# Patient Record
Sex: Male | Born: 1960 | Race: White | Hispanic: No | Marital: Married
Health system: Midwestern US, Community
[De-identification: ages and names within clinical notes are randomized; demographics above are authoritative.]

## PROBLEM LIST (undated history)

## (undated) DIAGNOSIS — I10 Essential (primary) hypertension: Secondary | ICD-10-CM

## (undated) DIAGNOSIS — K2100 Gastro-esophageal reflux disease with esophagitis, without bleeding: Secondary | ICD-10-CM

## (undated) DIAGNOSIS — E782 Mixed hyperlipidemia: Secondary | ICD-10-CM

## (undated) DIAGNOSIS — Z0000 Encounter for general adult medical examination without abnormal findings: Secondary | ICD-10-CM

---

## 2012-07-10 MED FILL — SODIUM CHLORIDE 0.45 % IV SOLN: 0.45 % | INTRAVENOUS | Qty: 1000

## 2012-07-14 MED FILL — MIDAZOLAM HCL 5 MG/ML IJ SOLN: 5 MG/ML | INTRAMUSCULAR | Qty: 1

## 2012-07-14 MED FILL — FENTANYL CITRATE 0.05 MG/ML IJ SOLN: 0.05 MG/ML | INTRAMUSCULAR | Qty: 5

## 2016-04-12 NOTE — Patient Instructions (Signed)
Name_______________________________________Printed:____________________  Date and time of surgery__10-23-17______________________Arrival Time:__0530 [facility]______________   1. Do not eat or drink anything after 12 midnight (or____hours) prior to surgery. This includes no water, chewing gum or mints.   2. Take the following pills with a small sip of water on the morning of surgery_____________________________________________________   3. Aspirin, Ibuprofen, Advil, Naproxen, Vitamin E and other Anti-inflammatory products should be stopped for 5 days before surgery or as directed by your physician.   4. Check with your Doctor regarding stopping Plavix, Coumadin,Eliquis, Lovenox,Effient,Pradaxa,Xarelto, Fragmin or other blood thinners and follow their instructions.   5. Do not smoke, and do not drink any alcoholic beverages 24 hours prior to surgery.  This includes NA Beer.   6. You may brush your teeth and gargle the morning of surgery.  DO NOT SWALLOW WATER   7. You MUST make arrangements for a responsible adult to stay on site while you are here and take you home after your surgery. You will not be allowed to leave alone or drive yourself home.  It is strongly suggested someone stay with you the first 24 hrs. Your surgery will be cancelled if you do not have a ride home.   8. A parent/legal guardian must accompany a child scheduled for surgery and plan to stay at the hospital until the child is discharged.  Please do not bring other children with you.   9. Please wear simple, loose fitting clothing to the hospital.  Do not bring valuables (money, credit cards, checkbooks, etc.) Do not wear any makeup (including no eye makeup) or nail polish on your fingers or toes.             10. DO NOT wear any jewelry or piercings on day of surgery.  All body piercing jewelry must be removed.             11. If you have ___dentures, they will be removed before going to the OR; we will provide you a container.  If you wear ___contact  lenses or ___glasses, they will be removed; please bring a case for them.             12. Please see your family doctor/pediatrician for a history & physical and/or concerning medications.  Bring any test results/reports from your physician's office.   PCP__________________Phone___________H&P Appt. Date________             13 If you  have a Living Will and Durable Power of Attorney for Healthcare, please bring in a copy.             14. Notify your Surgeon if you develop any illness between now and surgery  time, cough, cold, fever, sore throat, nausea, vomiting, etc.  Please notify your surgeon if you experience dizziness, shortness of breath or blurred vision between now & the time of your surgery             15. DO NOT shave your operative site 96 hours prior to surgery. For face & neck surgery, men may use an electric razor 48 hours prior to surgery.             16. Shower the night before surgery with ___Antibacterial soap ___Hibiclens             17. To provide excellent care visitors will be limited to one in the room at any given time.             18.  Please bring picture  ID and insurance card.             19.  Visit our web site for additional information:  [email or web address]              20.During flu season no children under the age of 26 are permitted in the hospital for the safety of all patients.                              21. If you take a long acting insulin in the evening only  take half of your usual  dose the night  before your procedure              22. If you use a c-pap please bring DOS if staying overnight,             23.For your convenience [facility] has a pharmacy on site to fill your prescriptions.             24. If you use oxygen and have a portable tank please bring it  with you the DOS             25. Bring a complete list of all your medications with name and dose include any supplements.             26. Other__________________________________________   *Please call pre  admission testing if you any further questions   Ouida Sills         [facility]   [place]    [place]  [phone]   [place]       All above information reviewed with patient in person or by phone.Patient verbalizes understanding.All questions and concerns addressed.                                                                                                 Patient/Rep____________________                                                                                                                                    PRE OP INSTRUCTIONS

## 2016-04-16 ENCOUNTER — Inpatient Hospital Stay: Attending: Gastroenterology | Primary: Family Medicine

## 2016-04-16 MED ORDER — NORMAL SALINE FLUSH 0.9 % IV SOLN
0.9 % | INTRAVENOUS | Status: DC | PRN
Start: 2016-04-16 — End: 2016-04-16

## 2016-04-16 MED ORDER — SODIUM CHLORIDE 0.9 % IV SOLN
0.9 % | INTRAVENOUS | Status: AC
Start: 2016-04-16 — End: ?

## 2016-04-16 MED ORDER — SODIUM CHLORIDE 0.9 % IV SOLN
0.9 % | INTRAVENOUS | Status: DC
Start: 2016-04-16 — End: 2016-04-16

## 2016-04-16 MED ORDER — NORMAL SALINE FLUSH 0.9 % IV SOLN
0.9 % | Freq: Two times a day (BID) | INTRAVENOUS | Status: DC
Start: 2016-04-16 — End: 2016-04-16

## 2016-04-16 MED ORDER — ONDANSETRON HCL 4 MG/2ML IJ SOLN
4 MG/2ML | INTRAMUSCULAR | Status: DC | PRN
Start: 2016-04-16 — End: 2016-04-17

## 2016-04-16 MED ORDER — LIDOCAINE HCL (PF) 1 % IJ SOLN
1 % | Freq: Once | INTRAMUSCULAR | Status: AC | PRN
Start: 2016-04-16 — End: 2016-04-16

## 2016-04-16 MED FILL — SODIUM CHLORIDE 0.9 % IV SOLN: 0.9 % | INTRAVENOUS | Qty: 1000

## 2016-04-16 MED FILL — LIDOCAINE HCL (CARDIAC) 20 MG/ML IV SOLN: 20 MG/ML | INTRAVENOUS | Qty: 5

## 2016-04-16 MED FILL — PROPOFOL 200 MG/20ML IV EMUL: 200 MG/20ML | INTRAVENOUS | Qty: 20

## 2016-04-16 NOTE — Anesthesia Pre-Procedure Evaluation (Signed)
[facility] Department of Anesthesiology  Pre-Anesthesia Evaluation/Consultation       Name:  Ronnie King                                         Age:  55 y.o.  MRN:    [id number]           Procedure (Scheduled): EGD ESOPHAGOGASTRODUODENOSCOPY   Surgeon:     Dr. Delmar Landau, MD     Allergies   Allergen Reactions   ??? Milk-Related Compounds      There is no problem list on file for this patient.    Past Medical History:   Diagnosis Date   ??? Hyperlipidemia    ??? Hypertension      No past surgical history on file.  Social History   Substance Use Topics   ??? Smoking status: Never Smoker   ??? Smokeless tobacco: Former Neurosurgeon   ??? Alcohol use Yes      Comment: 0-1 WEEKLY       Prior to Admission medications    Medication Sig Start Date End Date Taking? Authorizing Provider   lisinopril (PRINIVIL;ZESTRIL) 20 MG tablet Take 20 mg by mouth daily    Historical Provider, MD   aspirin 81 MG tablet Take 81 mg by mouth daily    Historical Provider, MD   simvastatin (ZOCOR) 20 MG tablet Take 20 mg by mouth nightly    Historical Provider, MD       Current Outpatient Prescriptions   Medication Sig Dispense Refill   ??? lisinopril (PRINIVIL;ZESTRIL) 20 MG tablet Take 20 mg by mouth daily     ??? aspirin 81 MG tablet Take 81 mg by mouth daily     ??? simvastatin (ZOCOR) 20 MG tablet Take 20 mg by mouth nightly       Current Facility-Administered Medications   Medication Dose Route Frequency Provider Last Rate Last Dose   ??? lidocaine PF 1 % injection 1 mL  1 mL Intradermal Once PRN Koren Bound, DO       ??? ondansetron [facility]) injection 4 mg  4 mg Intravenous PRN Koren Bound, DO           Vital Signs  (Current) There were no vitals filed for this visit.  (for past 48 hrs)  No Data Recorded  (last three values)   BP Readings from Last 3 Encounters:   No data found for BP       CBC  No results found for: WBC, RBC, HGB, HCT, MCV, RDW, PLT    CMP  No results found for: NA, K, CL, CO2, BUN, CREATININE, GFRAA, AGRATIO, LABGLOM, GLUCOSE, PROT,  CALCIUM, BILITOT, ALKPHOS, AST, ALT    BMP  No results found for: NA, K, CL, CO2, BUN, CREATININE, CALCIUM, GFRAA, LABGLOM, GLUCOSE    Coags   No results found for: PROTIME, INR, APTT    HCG (If Applicable) No results found for: PREGTESTUR, PREGSERUM, HCG, HCGQUANT     ABGs  No results found for: PHART, PO2ART, PCO2ART, HCO3ART, BEART, O2SATART     Type & Screen (If Applicable)  No results found for: LABABO, LABRH      POCGlucose  No results for input(s): GLUCOSE in the last 72 hours.     NPO Status  > 8 hours  BMI  There is no height or weight on file to calculate BMI.  Estimated body mass index is 26.45 kg/m?? as calculated from the following:    Height as of 04/12/16: 6' (1.829 m).    Weight as of 04/12/16: 195 lb (88.5 kg).      Additional Testing (Echo, Stress, ECG, PFTs, etc)        Anesthesia Evaluation  Patient summary reviewed and Nursing notes reviewed  Airway: Mallampati: II  TM distance: >3 FB   Neck ROM: full  Mouth opening: > = 3 FB Dental: normal exam         Pulmonary:normal exam                               Cardiovascular:    (+) hypertension: mild, hyperlipidemia        Rhythm: regular  Rate: normal           Beta Blocker:  Not on Beta Blocker         Neuro/Psych:               GI/Hepatic/Renal:             Endo/Other:                     Abdominal:           Vascular:                                      Anesthesia Plan      MAC     ASA 2     (Plan for MAC with standard ASA monitoring. Additional monitoring as dictated by intra-operative course. Patient appropriately NPO for the procedure. Risk/Benefits reviewed with patient and all anesthetic questions answered prior to procedure.  )  Induction: intravenous.    MIPS: Postoperative opioids intended and Prophylactic antiemetics administered.  Anesthetic plan and risks discussed with patient.      Plan discussed with CRNA.                DOS STAFF ADDENDUM:    Pt seen and examined, chart reviewed (including anesthesia, drug and  allergy history).  No interval changes to history and physical examination.  Anesthetic plan, risks, benefits, alternatives, and personnel involved discussed with patient.  Patient verbalized an understanding and agrees to proceed.      Koren Boundravis W Maja Mccaffery, DO  April 16, 2016  6:32 AM

## 2016-04-16 NOTE — Anesthesia Post-Procedure Evaluation (Signed)
Anesthesia Post-op Note    Patient: Ronnie King  MRN: [id number](252) [id number]  Birthdate: 1961-02-16  Date of evaluation: 04/16/2016  Time:  1:06 PM     Procedure(s) Performed:     Last Vitals: There were no vitals taken for this visit.    Aldrete Phase I:      Aldrete Phase II:      Anesthesia Post Evaluation    Final anesthesia type: MAC  Patient location during evaluation: PACU  Patient participation: complete - patient participated  Level of consciousness: awake and alert  Pain score: 0  Airway patency: patent  Nausea & Vomiting: no nausea and no vomiting  Complications: no  Cardiovascular status: blood pressure returned to baseline  Respiratory status: acceptable  Hydration status: euvolemic        Koren Boundravis W Montey Ebel, DO  1:06 PM

## 2016-07-09 NOTE — Patient Instructions (Signed)
Patient not reached.  Preop instructions left on voice mail. Number___[phone]____________    -Date_1/29/18 0700AM______time__0530[facility]_____arrival____________  -Nothing to eat or drink after midnight  -Responsible adult 18 or older to stay on site while you are here and drive you home and stay with you after  -Follow any instructions your doctors office has given you  -Bring a complete list of all your medications and supplements  -If you normally take the following medications in the morning please do so with a small    sip of water-heart,blood pressure,seizure,breathing or thyroid-avoid water pillls and any   medication ending in "pril"-you may use your inhalers  -Take half of your normal dose of any long acting insulins the night before-do not take    any diabetic medications in the morning  -Follow your doctors instructions regarding blood thinners  -Any questions call your surgeons office  Anesthesia attempts to review all Endo charts prior to surgery and will place any PAT orders,Surgery patients will have orders placed based on history in chart which may not be complete  ENDOSCOPY PATIENTS ONLY-FOLLOW YOUR DOCTORS BOWEL PREP INSTRUCTIONS,THIS MAY INCLUDE TAKING A SECOND PORTION OF YOUR PREP AFTER MIDNIGHT

## 2016-07-23 ENCOUNTER — Inpatient Hospital Stay: Attending: Gastroenterology | Primary: Family Medicine

## 2020-03-30 ENCOUNTER — Encounter: Attending: Family Medicine | Primary: Family Medicine

## 2020-03-30 NOTE — Progress Notes (Deleted)
Ronnie King   59 y.o. male   1961/02/05    This is patient's first visit with me.  Scotti Kosta is here to establish care as their new PCP.      Aloys Hupfer has a PMH significant for:    There is no problem list on file for this patient.      Reason(s) for visit:   No chief complaint on file.      HPI:    Chart review:  -Had EGD in Oct 2017.  -Had c-scope in Jan 2014.    Office visit encounter:      PDMP monitoring:  -No noteworthy findings.  -Last report:   Last PDMP Loraine Leriche as Reviewed [facility]):  Review User Review Instant Review Result   Dianne Whelchel 03/26/2020  1:14 PM Reviewed PDMP [1]         Allergies   Allergen Reactions   ??? Milk-Related Compounds        Current Outpatient Medications on File Prior to Visit   Medication Sig Dispense Refill   ??? lisinopril (PRINIVIL;ZESTRIL) 20 MG tablet Take 20 mg by mouth daily     ??? aspirin 81 MG tablet Take 81 mg by mouth daily     ??? simvastatin (ZOCOR) 20 MG tablet Take 20 mg by mouth nightly       No current facility-administered medications on file prior to visit.        No family history on file.    Social History     Tobacco Use   ??? Smoking status: Never Smoker   ??? Smokeless tobacco: Former Estate agent Use Topics   ??? Alcohol use: Yes     Comment: 0-1 WEEKLY        No results found for: WBC, HGB, HCT, MCV, PLT       Chemistry    No results found for: NA, K, CL, CO2, BUN, CREATININE No results found for: CALCIUM, ALKPHOS, AST, ALT, BILITOT       No results found for: ALT, AST, GGT, ALKPHOS, BILITOT  No results found for: LABA1C  No results found for: EAG    Review of Systems ***  Wt Readings from Last 3 Encounters:   04/12/16 195 lb (88.5 kg)       BP Readings from Last 3 Encounters:   No data found for BP       Pulse Readings from Last 3 Encounters:   No data found for Pulse       There were no vitals taken for this visit.     Physical Exam ***  Assessment/Plan:   There are no diagnoses linked to this encounter.   I reviewed the plan of care with the  patient. Patient acknowledged understanding and agreed with plan of care overall.    There are no discontinued medications.     General information on medications:  -When it comes to medications, whether with starting or adding a new medication or increasing the dose of a current medication, the benefits and risks have to always be considered and weighed over, especially if one is taking other medications as well.   -There are no medications that have no side effects and that there is always a risk involved with taking a medication.    -If a side effect were to occur with starting a new medication or with increasing the dose of a current medication that either the medication can be totally discontinued altogether or simply decrease the  dose of it and if this would be the case a follow-up appointment would be deemed necessary.    -The drug allergy list will then be updated with the corresponding side effect(s) if it's deemed to be a true 'drug allergy'.    -The most common adverse effects of medication(s) were addressed at today's visit.    -Lastly, the coverage status of a medication may vary from insurance to insurance and the only way to verify if the medication is covered is to send an actual prescription in.    -The drug formulary of each insurance changes without any warning or notification to the healthcare provider let alone the pharmacy.  -The cost of medications vary from insurance to insurance and the cost is always subject to change just like the drug formulary.    Follow-up: No follow-ups on file..     Patient was informed that if his or her symptoms worsen to follow up with me sooner or go to the nearest ER if the symptoms are very significant and warrant higher level of care.    Regarding my note:  -This note was composed (by me only and not with assistance via a scribe) to the best of my knowledge and recollection of the encounter with the patient using one of my own customized note templates  utilizing a combination of typing and dictating with the [employer] medical speech recognition software.  As a result, the note may possibly have various errors (e.g. spelling, grammar, and non-sensible words/phrases/statements) despite reviewing the note prior to signing it for completion.        Lelar Farewell P. [name] M.D.  Family Medicine  [facility] - Family Medicine - [facility] Chalk    Electronically signed by Christoper Allegra on 03/26/2020 at 1:14 PM.

## 2020-04-20 ENCOUNTER — Ambulatory Visit
Admit: 2020-04-20 | Discharge: 2020-04-20 | Payer: BLUE CROSS/BLUE SHIELD | Attending: Family Medicine | Primary: Family Medicine

## 2020-04-20 DIAGNOSIS — Z7689 Persons encountering health services in other specified circumstances: Secondary | ICD-10-CM

## 2020-04-20 MED ORDER — OMEPRAZOLE 20 MG PO CPDR
20 MG | ORAL_CAPSULE | Freq: Every day | ORAL | 1 refills | Status: DC
Start: 2020-04-20 — End: 2020-10-19

## 2020-04-20 MED ORDER — SIMVASTATIN 20 MG PO TABS
20 MG | ORAL_TABLET | Freq: Every evening | ORAL | 1 refills | Status: DC
Start: 2020-04-20 — End: 2020-10-19

## 2020-04-20 MED ORDER — LISINOPRIL 10 MG PO TABS
10 MG | ORAL_TABLET | ORAL | 1 refills | Status: DC
Start: 2020-04-20 — End: 2020-10-19

## 2020-07-20 ENCOUNTER — Telehealth
Admit: 2020-07-20 | Discharge: 2020-07-20 | Payer: BLUE CROSS/BLUE SHIELD | Attending: Family Medicine | Primary: Family Medicine

## 2020-07-20 DIAGNOSIS — J0140 Acute pansinusitis, unspecified: Secondary | ICD-10-CM

## 2020-07-20 MED ORDER — CLASSIC NETI POT SINUS WASH 2300-700 MG NA KIT
2300-700 MG | PACK | NASAL | 0 refills | Status: AC
Start: 2020-07-20 — End: 2021-11-01

## 2020-07-20 MED ORDER — PSEUDOEPH-BROMPHEN-DM 30-2-10 MG/5ML PO SYRP
2-30-10 MG/5ML | Freq: Four times a day (QID) | ORAL | 0 refills | Status: AC | PRN
Start: 2020-07-20 — End: 2021-11-01

## 2020-07-20 MED ORDER — AMOXICILLIN-POT CLAVULANATE 875-125 MG PO TABS
875-125 MG | ORAL_TABLET | Freq: Two times a day (BID) | ORAL | 0 refills | Status: AC
Start: 2020-07-20 — End: 2020-07-25

## 2020-07-20 MED ORDER — FLUTICASONE PROPIONATE 50 MCG/ACT NA SUSP
50 MCG/ACT | Freq: Every day | NASAL | 1 refills | Status: DC
Start: 2020-07-20 — End: 2020-10-19

## 2020-07-20 NOTE — Progress Notes (Signed)
Ronnie King   60 y.o. male   [birth year]    HPI:    Virtual visit encounter type:   '[x]'  Doxy.me  '[]'  Telephone w/o video  '[]'  MyChart  '[]'  Other:     Patient was last seen by me on 04/20/2020.    Reason(s) for visit:   Chief Complaint   Patient presents with   ??? Cough   ??? Sinus Problem     Green mucus. Sx started last Thursday.   ??? Covid Testing     Pt was tested for covid Saturday. Rapid and PCR were both negative.   ??? Pharyngitis   ??? Congestion   ??? Drainage       Patient was seen today for issues noted below.    History:     Symptom duration: (# of days)  '[]'  1   '[]'  2   '[]'  3   '[]'  4   '[]'  5   '[x]'  6   '[]'  7   '[]'  8   '[]'  9   '[]'  10   '[]'  11   '[]'  12   '[]'  13 '[]'  14 + '[]'  None    Symptom course:   '[]'  Worsening     '[]'  Stable     '[]'  Improving     '[]'  Asymptomatic     COVID-19 test:  '[]'  Positive       '[]'  PCR test      '[]'  Rapid test  '[]'  Negative      '[x]'  PCR test      '[x]'  Rapid test   '[]'  Not performed    Influenza test:   '[]'  Positive   '[]'  Negative  '[]'  Not performed    Other historical relevant factors:  '[]'  Exposure to known sick contacts:  '[]'  Social gatherings:    '[]'  Close contact with a lab confirmed COVID-19 patient within 14 days of symptom onset  '[]'  History of travel from affected geographical areas within 14 days of symptom onset  '[]'  Health care worker exposure w/ no symptoms  '[]'  Health care worker exposure symptomatic    COVID-19 vaccination status:  '[]'  Vaccinated  '[]'  Unvaccinated    Influenza vaccination status:  '[]'  Vaccinated  '[]'  Unvaccinated    Immunization History   Administered Date(s) Administered   ??? COVID-19, Pfizer Purple top, DILUTE for use, 12+ yrs, 68mg/0.3mL dose 09/17/2019, 10/03/2019, 06/12/2020   ??? Hepatitis A Adult (Havrix, Vaqta) 10/15/2013, 07/21/2014   ??? Influenza Virus Vaccine 03/24/2019   ??? Tdap (Boostrix, Adacel) 10/15/2013, 11/15/2018       Symptoms:  Review of Systems   Constitutional: Positive for chills. Negative for activity change, appetite change, fatigue, fever and unexpected weight change.    HENT: Positive for congestion and sore throat (initial sx). Negative for rhinorrhea, sinus pressure and trouble swallowing.    Respiratory: Negative for cough, chest tightness, shortness of breath and wheezing.    Cardiovascular: Negative for chest pain, palpitations and leg swelling.   Gastrointestinal: Negative for abdominal distention, abdominal pain, blood in stool, constipation, diarrhea, nausea and vomiting.   Genitourinary: Negative for dysuria, frequency and hematuria.   Musculoskeletal: Negative for arthralgias, back pain, myalgias and neck pain.   Skin: Negative for rash.   Neurological: Negative for dizziness, weakness, light-headedness, numbness and headaches.   Psychiatric/Behavioral: Negative for sleep disturbance.     '[]'  OTHER SYMPTOMS:      Medications used:  '[]'  NSAID:             '[]'   Ibuprofen (Motrin) - last used:             '[]'  Naproxen (Aleve) - last used:              '[]'  Meloxicam (Mobic)             '[]'  Celecoxib (Celebrex)    '[]'  Tylenol - last used:   '[]'  Anti-tussives -  '[]'  Anti-emetics -  '[]'  Nasal spray -  '[x]'  Other: decongestants   '[]'  None    Medical risk factors:  '[]'  Pregnant or possibly pregnant  '[]'  Age 42 and older  '[]'  Diabetes  '[]'  Heart disease  '[]'  Asthma  '[]'  COPD/Other chronic lung diseases  '[]'  Active cancer  '[]'  On chemotherapy/immunosuppressive drugs  '[]'  Currently or recent history of taking oral corticosteroids  '[]'  History of lymphoma/leukemia  '[]'  History of tobacco smoking  '[]'  Current tobacco smoker  '[]'  Obesity  '[]'  Other:    HTN:  -BP in [employer] and biometric physical at work was great  -Patient denied issues with frequent headache, chest pain, shortness of breath, palpitations, malaise, etc.    GERD:  -No worsening of GERD due to recent infection.      Allergies   Allergen Reactions   ??? Milk-Related Compounds        Current Outpatient Medications on File Prior to Visit   Medication Sig Dispense Refill   ??? Omega-3 Fatty Acids (FISH OIL PO) Take 3,000 mg by mouth     ??? Cyanocobalamin  (B-12 PO) Take 1,000 mg by mouth     ??? Cholecalciferol (D3 PO) Take 4,000 mg by mouth     ??? Multiple Vitamins-Minerals (CENTRUM SILVER 50+MEN PO) Take by mouth     ??? EPINEPHrine HCl, Anaphylaxis, (EPIPEN IM) Inject into the muscle     ??? simvastatin (ZOCOR) 20 MG tablet Take 1 tablet by mouth nightly 90 tablet 1   ??? omeprazole (PRILOSEC) 20 MG delayed release capsule Take 1 capsule by mouth daily 90 capsule 1   ??? lisinopril (PRINIVIL;ZESTRIL) 10 MG tablet 1 tablet 90 tablet 1   ??? aspirin 81 MG tablet Take 81 mg by mouth daily        No current facility-administered medications on file prior to visit.        No family history on file.    Social History     Tobacco Use   ??? Smoking status: Never Smoker   ??? Smokeless tobacco: Former [employer] Use Topics   ??? Alcohol use: Yes     Comment: Occ        No results found for: WBC, HGB, HCT, MCV, PLT       Chemistry    No results found for: NA, K, CL, CO2, BUN, CREATININE, GLU No results found for: CALCIUM, ALKPHOS, AST, ALT, BILITOT       No results found for: ALT, AST, GGT, ALKPHOS, BILITOT  No results found for: LABA1C  No results found for: EAG    Review of systems:  As noted above. Otherwise, rest of ROS is negative.    Wt Readings from Last 3 Encounters:   04/20/20 202 lb 3.2 oz (91.7 kg)   04/12/16 195 lb (88.5 kg)       BP Readings from Last 3 Encounters:   04/20/20 136/86       Pulse Readings from Last 3 Encounters:   04/20/20 76       There were no vitals taken for this  visit.     Physical Exam  Vitals reviewed.   Constitutional:       General: He is awake. He is not in acute distress.     Appearance: He is overweight. He is not ill-appearing or diaphoretic.   HENT:      Head: Normocephalic and atraumatic. No abrasion or masses. Hair is normal.      Right Ear: External ear normal.      Left Ear: External ear normal.      Nose: Nose normal.   Eyes:      General: Lids are normal. Gaze aligned appropriately. No scleral icterus.        Right eye: No discharge.          Left eye: No discharge.      Extraocular Movements: Extraocular movements intact.      Conjunctiva/sclera: Conjunctivae normal.   Neck:      Trachea: Phonation normal.   Pulmonary:      Effort: Pulmonary effort is normal. No respiratory distress.      Breath sounds: No wheezing, rhonchi or rales.   Abdominal:      General: Abdomen is flat. There is no distension.      Palpations: Abdomen is soft.   Musculoskeletal:         General: No deformity. Normal range of motion.      Cervical back: Normal range of motion. No erythema.      Right lower leg: No edema.      Left lower leg: No edema.   Skin:     Coloration: Skin is not cyanotic, jaundiced or pale.      Findings: No abrasion, abscess, bruising, ecchymosis, erythema, signs of injury, laceration, lesion, petechiae, rash or wound.   Neurological:      General: No focal deficit present.      Mental Status: He is alert. Mental status is at baseline.      GCS: GCS eye subscore is 4. GCS verbal subscore is 5. GCS motor subscore is 6.      Cranial Nerves: No cranial nerve deficit, dysarthria or facial asymmetry.      Motor: No weakness, tremor, atrophy or seizure activity.      Coordination: Coordination normal.      Gait: Gait is intact.   Psychiatric:         Attention and Perception: Attention and perception normal.         Mood and Affect: Mood and affect normal.         Speech: Speech normal.         Behavior: Behavior normal. Behavior is cooperative.         Thought Content: Thought content normal.       Assessment/Plan:   Ronnie King was seen today for cough, sinus problem, covid testing, pharyngitis, congestion and drainage.    Diagnoses and all orders for this visit:    Acute non-recurrent pansinusitis  -     fluticasone (FLONASE) 50 MCG/ACT nasal spray; 2 sprays by Each Nostril route daily  -     brompheniramine-pseudoephedrine-DM 2-30-10 MG/5ML syrup; Take 5 mLs by mouth every 6 hours as needed for Congestion or Cough  -     amoxicillin-clavulanate (AUGMENTIN)  875-125 MG per tablet; Take 1 tablet by mouth every 12 hours for 5 days  -     Sodium Chloride-Sodium Bicarb (CLASSIC NETI POT SINUS [place]) 2300-700 MG KIT; 1 each. Irrigate sinuses PRN for nasal congestion  Essential hypertension  Comments:  Controlled. Continue current med regiment.    Gastroesophageal reflux disease with esophagitis without hemorrhage  Comments:  Stable.    Educated about 2019 novel coronavirus infection      Discussion about:  '[x]'  Supportive care - hydration with plenty of water and/or Gatorade/Powerade or nutritional shakes (e.g. Ensure, Boost, etc.)  '[x]'  Monitor temperature and vital signs.  '[]'  Informed COVID-19 is a viral infection.  Antibiotics are not recommended in the treatment of viral infections (routinely).    '[]'  Monoclonal antibody - patient vocalized understanding that this therapy was approved by FDA under EU[place] and gave permission for approval to be scheduled an appointment for an infusion.    Recommendations:  '[]'  Advised patient to notify anyone that they have been in close contact with about their symptoms.  '[]'  Recommended quarantining at least 5 days (per current CDC guidelines) from either day of exposure or from onset of symptoms.  '[]'  Informed patient that having a COVID-19 test done prior to day 5 (of either exposure or symptom onset) could lead to a false negative result and that testing would be recommended (preferably with a PCR test) on day 5 or later.  '[]'  Recommended obtaining a pulse oximeter (if unavailable) to measure oxygen saturation.   '[]'  Recommended using an incentive spirometer and deep breathing exercises to help prevent atelectasis.  '[]'  Patient was offered as needed medications (inhaler, anti-tussive, anti-emetic, etc.) but declined.    Other:  '[]'  Notification to work/school letter provided to patient.    I reviewed the plan of care with the patient. Patient acknowledged understanding and agreed with plan of care overall.    There are no discontinued  medications.     General information on medications:  -When it comes to medications, whether with starting or adding a new medication or increasing the dose of a current medication, the benefits and risks have to always be considered and weighed over, especially if one is taking other medications as well.   -There are no medications that have no side effects and that there is always a risk involved with taking a medication.    -If a side effect were to occur with starting a new medication or with increasing the dose of a current medication that either the medication can be totally discontinued altogether or simply decrease the dose of it and if this would be the case a follow-up appointment would be deemed necessary.    -The drug allergy list will then be updated with the corresponding side effect(s) if it's deemed to be a true 'drug allergy'.    -The most common adverse effects of medication(s) were addressed at today's visit.    -Lastly, the coverage status of a medication may vary from insurance to insurance and the only way to verify if the medication is covered is to send an actual prescription in.    -The drug formulary of each insurance changes without any warning or notification to the healthcare provider let alone the pharmacy.  -The cost of medications vary from insurance to insurance and the cost is always subject to change just like the drug formulary.    General disclaimer regarding telemedicine (virtual) visits:  Yochanan Eddleman is a 60 y.o. male is being evaluated by a virtual visit (video visit) and/or telephone (without video) encounter to address their concern(s) as mentioned above.  Prior to arranging this appointment, the patient was made aware of the need and importance to schedule the visit in this  manner given the current ongoing COVID-19 pandemic.  The patient was also made aware that the telemedicine service used ([email or web address].g.[email or web address]) would not save let alone record any information (audio, video,  and text) regarding the actual virtual visit.  After this discussion, the patient acknowledged understanding and agreed to today's virtual visit encounter.  A caregiver was present when appropriate as well as an interpreter if requested.   The patient is aware that telemedicine visits are a billable service just like an in person visit is. The patient was located in a state where the healthcare provider was licensed to provide care.    Due to this being a TeleHealth encounter (during the XTGGY-69 public health emergency), evaluation of the following organ systems was overall limited: Vitals/Constitutional/EENT/Resp/CV/GI/GU/MS/Neuro/Skin/Heme-Lymph-Imm.  As a result, establishing a diagnosis(s) and formulating a plan of care may be overall more difficult and complicated compared to a conventional (face-to-face) office visit.  The patient was made aware about the potential limitations and difficulties of a telemedicine visit such as having technical difficulties related to video and/or audio issues often stemming from a sub-optimal/poor Internet or cellular data connection and/or other factors.  If this is judged to be the case then the patient would be informed if deemed relevant and necessary that an in-person follow-up visit may be recommended.      Pursuant to the emergency declaration under the [place] waiver authority and the [employer] Act, this virtual visit was conducted with the patient's (and/or legal guardian's) consent, to reduce the patient's risk (as well as others) of exposure to COVID-19 and to continue to maintain and provide necessary medical care.  The patient (and/or legal guardian) has also been advised to contact this office for worsening conditions or problems, and seek emergency medical treatment and/or call 911 if deemed necessary.    Follow-up: No follow-ups on file..     Patient was informed  that if his or her symptoms worsen to follow up with me sooner or go to the nearest ER if the symptoms are very significant and warrant higher level of care.    Regarding my note:  -This note was composed (by me only and not with assistance via a scribe) to the best of my knowledge and recollection of the encounter with the patient using one of my own customized note templates utilizing a combination of typing and dictating with the [employer] speech recognition software.  As a result, the note may possibly contain various errors (e.g. spelling, grammar, and non-sensible words/phrases/statements) despite reviewing the note prior to signing it for completion.    -Total time for this encounter: level of service not based on length of visit.    Travontae Freiberger P. Miguel Aschoff M.D.  [place]    Electronically signed by Katheren Shams M.D. on 07/20/2020 at 8:52 AM.

## 2020-10-18 ENCOUNTER — Encounter

## 2020-10-18 NOTE — Telephone Encounter (Signed)
LVM for pt  to call the office back in regards to   his appt tomorrow.    Trying to advise pt that his appt tomorrow is a physical but he is due for his meds tomorrow as well. Going to see if pt would like to change physical into med refill and do physical different day.

## 2020-10-19 ENCOUNTER — Encounter
Admit: 2020-10-19 | Discharge: 2020-10-19 | Payer: BLUE CROSS/BLUE SHIELD | Attending: Family Medicine | Primary: Family Medicine

## 2020-10-19 DIAGNOSIS — Z0000 Encounter for general adult medical examination without abnormal findings: Secondary | ICD-10-CM

## 2020-10-19 MED ORDER — OMEPRAZOLE 20 MG PO CPDR
20 MG | ORAL_CAPSULE | Freq: Every day | ORAL | 3 refills | Status: AC
Start: 2020-10-19 — End: 2021-10-18

## 2020-10-19 MED ORDER — LISINOPRIL 10 MG PO TABS
10 MG | ORAL_TABLET | ORAL | 3 refills | Status: DC
Start: 2020-10-19 — End: 2020-10-21

## 2020-10-19 MED ORDER — SIMVASTATIN 20 MG PO TABS
20 MG | ORAL_TABLET | Freq: Every evening | ORAL | 3 refills | Status: AC
Start: 2020-10-19 — End: 2021-10-23

## 2020-10-19 MED ORDER — FLUTICASONE PROPIONATE 50 MCG/ACT NA SUSP
50 MCG/ACT | Freq: Every day | NASAL | 1 refills | Status: DC
Start: 2020-10-19 — End: 2021-11-01

## 2020-10-19 NOTE — Progress Notes (Signed)
Ronnie King   60 y.o. male   [birth year]    HPI:    Patient was last seen by me on 07/20/2020.      Reason(s) for visit:   Chief Complaint   Patient presents with   ??? Medication Refill   ??? Annual Exam     Colorectal screening due       Health maintenance:    BP:  -BP in office: WNL  -Patient denied issues with frequent headache, chest pain, shortness of breath, palpitations, malaise, etc.    Weight/BMI:  Wt Readings from Last 3 Encounters:   10/19/20 204 lb 12.8 oz (92.9 kg)   04/20/20 202 lb 3.2 oz (91.7 kg)   04/12/16 195 lb (88.5 kg)     -Physical activity: walking.    Vision exam:  -Last exam: UTD  -No visual disturbances reported    Dental exam:  -Last exam: UTD  -No issues reported of dental pain, bleeding gums, temperature sensitivity, etc.     Colon CA screening:  -Last exam: January 2014  -No issues reported of hematochezia, melena, significant unintentional weight loss, chronic abdominal pain, nausea, vomiting, diarrhea, etc.    Immunization History   Administered Date(s) Administered   ??? COVID-19, Pfizer Purple top, DILUTE for use, 12+ yrs, 10mg/0.3mL dose 09/17/2019, 10/03/2019, 06/12/2020   ??? Hepatitis A Adult (Havrix, Vaqta) 10/15/2013, 07/21/2014   ??? Influenza Virus Vaccine 03/24/2019   ??? Tdap (Boostrix, Adacel) 10/15/2013, 11/15/2018       Allergies   Allergen Reactions   ??? Milk-Related Compounds        Current Outpatient Medications on File Prior to Visit   Medication Sig Dispense Refill   ??? brompheniramine-pseudoephedrine-DM 2-30-10 MG/5ML syrup Take 5 mLs by mouth every 6 hours as needed for Congestion or Cough 118 mL 0   ??? Sodium Chloride-Sodium Bicarb (CLASSIC NETI POT SINUS WASH) 2300-700 MG KIT 1 each. Irrigate sinuses PRN for nasal congestion 1 kit 0   ??? Omega-3 Fatty Acids (FISH OIL PO) Take 3,000 mg by mouth     ??? Cyanocobalamin (B-12 PO) Take 1,000 mg by mouth     ??? Cholecalciferol (D3 PO) Take 4,000 mg by mouth     ??? Multiple Vitamins-Minerals (CENTRUM SILVER 50+MEN PO) Take by  mouth     ??? EPINEPHrine HCl, Anaphylaxis, (EPIPEN IM) Inject into the muscle     ??? aspirin 81 MG tablet Take 81 mg by mouth daily        No current facility-administered medications on file prior to visit.        History reviewed. No pertinent family history.    Social History     Tobacco Use   ??? Smoking status: Never Smoker   ??? Smokeless tobacco: Former UChief Strategy OfficerUse Topics   ??? Alcohol use: Yes     Comment: Occ        No results found for: WBC, HGB, HCT, MCV, PLT       Chemistry    No results found for: NA, K, CL, CO2, BUN, CREATININE, GLU No results found for: CALCIUM, ALKPHOS, AST, ALT, BILITOT       No results found for: ALT, AST, GGT, ALKPHOS, BILITOT  No results found for: LABA1C  No results found for: EAG    Review of Systems   Constitutional: Negative for activity change, appetite change, fatigue, fever and unexpected weight change.   HENT: Negative for congestion, rhinorrhea, sinus pressure and trouble swallowing.  Respiratory: Negative for cough, chest tightness, shortness of breath and wheezing.    Cardiovascular: Negative for chest pain, palpitations and leg swelling.   Gastrointestinal: Negative for abdominal distention, abdominal pain, blood in stool, constipation, diarrhea, nausea and vomiting.   Genitourinary: Negative for dysuria, frequency and hematuria.   Musculoskeletal: Negative for arthralgias and back pain.   Skin: Negative for rash.   Neurological: Negative for dizziness, weakness, light-headedness, numbness and headaches.      Wt Readings from Last 3 Encounters:   10/19/20 204 lb 12.8 oz (92.9 kg)   04/20/20 202 lb 3.2 oz (91.7 kg)   04/12/16 195 lb (88.5 kg)       BP Readings from Last 3 Encounters:   10/19/20 118/82   04/20/20 136/86       Pulse Readings from Last 3 Encounters:   10/19/20 79   04/20/20 76       BP 118/82    Pulse 79    Temp 97.2 ??F (36.2 ??C)    Wt 204 lb 12.8 oz (92.9 kg)    SpO2 100%    BMI 27.78 kg/m??      Physical Exam  Vitals reviewed.   Constitutional:        General: He is awake. He is not in acute distress.     Appearance: He is not ill-appearing or diaphoretic.   HENT:      Head: Normocephalic and atraumatic. No abrasion or masses. Hair is normal.      Right Ear: External ear normal.      Left Ear: External ear normal.      Nose: Nose normal.   Eyes:      General: Lids are normal. Gaze aligned appropriately. No scleral icterus.        Right eye: No discharge.         Left eye: No discharge.      Extraocular Movements: Extraocular movements intact.      Conjunctiva/sclera: Conjunctivae normal.   Neck:      Trachea: Phonation normal.   Cardiovascular:      Rate and Rhythm: Normal rate and regular rhythm.   Pulmonary:      Effort: Pulmonary effort is normal. No respiratory distress.      Breath sounds: No wheezing, rhonchi or rales.   Abdominal:      General: Abdomen is flat. There is no distension.      Palpations: Abdomen is soft.   Musculoskeletal:         General: No deformity. Normal range of motion.      Cervical back: Normal range of motion. No erythema.      Right lower leg: No edema.      Left lower leg: No edema.   Skin:     Coloration: Skin is not cyanotic, jaundiced or pale.      Findings: No abrasion, abscess, bruising, ecchymosis, erythema, signs of injury, laceration, lesion, petechiae, rash or wound.   Neurological:      General: No focal deficit present.      Mental Status: He is alert. Mental status is at baseline.      GCS: GCS eye subscore is 4. GCS verbal subscore is 5. GCS motor subscore is 6.      Cranial Nerves: No cranial nerve deficit, dysarthria or facial asymmetry.      Motor: No weakness, tremor, atrophy or seizure activity.      Coordination: Coordination normal.      Gait: Gait is  intact.   Psychiatric:         Attention and Perception: Attention and perception normal.         Mood and Affect: Mood and affect normal.         Speech: Speech normal.         Behavior: Behavior normal. Behavior is cooperative.         Thought Content: Thought  content normal.       Assessment/Plan:   Davionne was seen today for medication refill and annual exam.    Diagnoses and all orders for this visit:    Encounter for preventative adult health care examination  -     CBC with Auto Differential; Future  -     Hemoglobin A1C; Future  -     TSH; Future  -     T4, Free; Future  -     Renal Function Panel; Future  -     Hepatic Function Panel; Future  -     Lipid Panel; Future    Essential hypertension  -     lisinopril (PRINIVIL;ZESTRIL) 10 MG tablet; 1 tablet    Gastroesophageal reflux disease with esophagitis without hemorrhage  -     omeprazole (PRILOSEC) 20 MG delayed release capsule; Take 1 capsule by mouth daily    Mixed hyperlipidemia  -     simvastatin (ZOCOR) 20 MG tablet; Take 1 tablet by mouth nightly    Non-seasonal allergic rhinitis, unspecified trigger  -     fluticasone (FLONASE) 50 MCG/ACT nasal spray; 2 sprays by Each Nostril route daily    Overweight with body mass index (BMI) of 27 to 27.9 in adult  Comments:  Lifestyle measures discussed.      I reviewed the plan of care with the patient. Patient acknowledged understanding and agreed with plan of care overall.    Medications Discontinued During This Encounter   Medication Reason   ??? simvastatin (ZOCOR) 20 MG tablet REORDER   ??? omeprazole (PRILOSEC) 20 MG delayed release capsule REORDER   ??? lisinopril (PRINIVIL;ZESTRIL) 10 MG tablet REORDER   ??? fluticasone (FLONASE) 50 MCG/ACT nasal spray REORDER        Lifestyle measures discussed:  -Patient was advised to monitor caloric/sugar/carb intake, consume small portion sizes, well-balanced meals (esp with fruits & vegetables), and exercise at least 150 min/week. We discussed also reviewing nutritional labels before purchasing a product and for simplicity sake, purchase the food product that has overall less of everything. It is also recommended to avoid frequent snacking and late night eating.    Dental exam: recommended every 6 months as well as directed by  dentist    Visual exam: recommended annually    General information on medications:  -When it comes to medications, whether with starting or adding a new medication or increasing the dose of a current medication, the benefits and risks have to always be considered and weighed over, especially if one is taking other medications as well.   -There are no medications that have no side effects and that there is always a risk involved with taking a medication.    -If a side effect were to occur with starting a new medication or with increasing the dose of a current medication that either the medication can be totally discontinued altogether or simply decrease the dose of it and if this would be the case a follow-up appointment would be deemed necessary.    -The drug allergy list will then  be updated with the corresponding side effect(s) if it's deemed to be a true 'drug allergy'.    -The most common adverse effects of medication(s) were addressed at today's visit.    -Lastly, the coverage status of a medication may vary from insurance to insurance and the only way to verify if the medication is covered is to send an actual prescription in.    -The drug formulary of each insurance changes without any warning or notification to the healthcare provider let alone the pharmacy.  -The cost of medications vary from insurance to insurance and the cost is always subject to change just like the drug formulary.    Follow-up: Return in about 1 year (around 10/19/2021) for annual physical..     Patient was informed that if his or her symptoms worsen to follow up with me sooner or go to the nearest ER if the symptoms are very significant and warrant higher level of care.    Regarding my note:  -This note was composed (by me only and not with assistance via a scribe) to the best of my knowledge and recollection of the encounter with the patient using one of my own customized note templates utilizing a combination of typing and dictating  with the [employer] speech recognition software.  As a result, the note may possibly contain various errors (e.g. spelling, grammar, and non-sensible words/phrases/statements) despite reviewing the note prior to signing it for completion.      Time spent includes some or all of the following, both face-to-face time and non face-to-face time, but is not limited to:  '[x]'  Preparing to see the patient by reviewing medical records available (notes, labs, imaging, etc.) prior to seeing the patient.  '[x]'  Obtaining and/or reviewing the history from the patient.  '[x]'  Performing a medically appropriate examination.  '[x]'  Ordering of relevant lab work, medications, referrals, or procedures.  '[x]'  Discussing patient's medical issues and formulating an assessment and plan.   '[x]'  Reviewing plan of care with patient.  Answering any questions or concerns.   '[x]'  Documentation within the electronic health record (EHR)  '[]'  Reviewing records of history relevant to patient's issues after seeing the patient.  '[]'  Discussion or coordination of care with other health care professionals  '[]'  Other:     Shekera Beavers P. Miguel Aschoff M.D.  [place]    Electronically signed by Katheren Shams, MD M.D. on 10/19/2020 at 8:23 AM.

## 2020-10-20 ENCOUNTER — Telehealth

## 2020-10-20 NOTE — Telephone Encounter (Signed)
Pharmacy states the directions for the lisinopril 10mg  tab were not complete and only states "one tablet"     Best contact for pharmacy: [phone]

## 2020-10-21 MED ORDER — LISINOPRIL 10 MG PO TABS
10 MG | ORAL_TABLET | Freq: Every day | ORAL | 3 refills | Status: DC
Start: 2020-10-21 — End: 2021-11-06

## 2020-10-21 NOTE — Telephone Encounter (Signed)
New rx sent with updated directions.    Ronnie Bookbinder P. [name] MD  Family Medicine  [facility] - Family Medicine - [facility]

## 2020-12-31 ENCOUNTER — Encounter

## 2020-12-31 LAB — CBC WITH AUTO DIFFERENTIAL
Basophils %: 0.8 %
Basophils Absolute: 0.1 10*3/uL (ref 0.0–0.2)
Eosinophils %: 4 %
Eosinophils Absolute: 0.3 10*3/uL (ref 0.0–0.6)
Hematocrit: 44.9 % (ref 40.5–52.5)
Hemoglobin: 15.5 g/dL (ref 13.5–17.5)
Lymphocytes %: 18.4 %
Lymphocytes Absolute: 1.2 10*3/uL (ref 1.0–5.1)
MCH: 31 pg (ref 26.0–34.0)
MCHC: 34.4 g/dL (ref 31.0–36.0)
MCV: 90.1 fL (ref 80.0–100.0)
MPV: 7.3 fL (ref 5.0–10.5)
Monocytes %: 9.8 %
Monocytes Absolute: 0.6 10*3/uL (ref 0.0–1.3)
Neutrophils %: 67 %
Neutrophils Absolute: 4.3 10*3/uL (ref 1.7–7.7)
Platelets: 267 10*3/uL (ref 135–450)
RBC: 4.98 M/uL (ref 4.20–5.90)
RDW: 14 % (ref 12.4–15.4)
WBC: 6.4 10*3/uL (ref 4.0–11.0)

## 2020-12-31 LAB — RENAL FUNCTION PANEL
Anion Gap: 11 (ref 3–16)
BUN: 15 mg/dL (ref 7–20)
CO2: 25 mmol/L (ref 21–32)
Calcium: 9.2 mg/dL (ref 8.3–10.6)
Chloride: 101 mmol/L (ref 99–110)
Creatinine: 1 mg/dL (ref 0.9–1.3)
GFR African American: >60 (ref >60)
GFR Non-African American: >60 (ref >60)
Glucose: 101 mg/dL — ABNORMAL HIGH (ref 70–99)
Phosphorus: 3.5 mg/dL (ref 2.5–4.9)
Potassium: 4.6 mmol/L (ref 3.5–5.1)
Sodium: 137 mmol/L (ref 136–145)

## 2020-12-31 LAB — LIPID PANEL
Cholesterol, Total: 158 mg/dL (ref 0–199)
HDL: 33 mg/dL — ABNORMAL LOW (ref 40–60)
LDL Calculated: 83 mg/dL (ref <100)
Triglycerides: 209 mg/dL — ABNORMAL HIGH (ref 0–150)
VLDL Cholesterol Calculated: 42 mg/dL

## 2020-12-31 LAB — HEPATIC FUNCTION PANEL
ALT: 23 U/L (ref 10–40)
AST: 22 U/L (ref 15–37)
Albumin: 4.3 g/dL (ref 3.4–5.0)
Alkaline Phosphatase: 121 U/L (ref 40–129)
Bilirubin, Direct: <0.2 mg/dL (ref 0.0–0.3)
Total Bilirubin: 0.4 mg/dL (ref 0.0–1.0)
Total Protein: 6.9 g/dL (ref 6.4–8.2)

## 2020-12-31 LAB — T4, FREE: T4 Free: 1.3 ng/dL (ref 0.9–1.8)

## 2020-12-31 LAB — TSH: TSH: 1.51 u[IU]/mL (ref 0.27–4.20)

## 2021-01-01 LAB — HEMOGLOBIN A1C
Hemoglobin A1C: 5.3 %
eAG: 105.4 mg/dL

## 2021-07-20 NOTE — Telephone Encounter (Signed)
-----   Message from Orbie Pyo sent at 07/20/2021  9:58 AM EST -----  Subject: Appointment Request    Reason for Call: Established Patient Appointment needed: Routine Physical   Exam    QUESTIONS    Reason for appointment request? No appointments available during search     Additional Information for Provider? PT NEEDS HIS YEARLY PHYSICAL ,NEEDS   FOR 11-01-2021 AT 8:30AM IF POSSIBLE ,TRIED TO SCHEDULE WOULD NOT LET us   GO THAT FAR AHEAD  ---------------------------------------------------------------------------  --------------  Ronnie King INFO  [phone]; OK to leave message on voicemail  ---------------------------------------------------------------------------  --------------  SCRIPT ANSWERS  COVID Screen: [place]

## 2021-07-20 NOTE — Telephone Encounter (Signed)
Spoke with pt and scheduled appt. Pt verbalized understanding.

## 2021-10-11 ENCOUNTER — Encounter

## 2021-10-12 NOTE — Telephone Encounter (Signed)
From: Estelle Grumbles  To: Dr. Katheren Shams  Sent: 10/11/2021 7:28 PM EDT  Subject: Give blood appointment     Hi Dr. Miguel Aschoff,  Can you please make me an appointment to give blood before my physical appointment.   Thank you,  Ronnie King

## 2021-10-18 ENCOUNTER — Encounter

## 2021-10-18 MED ORDER — OMEPRAZOLE 20 MG PO CPDR
20 MG | ORAL_CAPSULE | Freq: Every day | ORAL | 0 refills | Status: AC
Start: 2021-10-18 — End: 2022-01-28

## 2021-10-21 ENCOUNTER — Encounter

## 2021-10-23 ENCOUNTER — Encounter

## 2021-10-23 MED ORDER — SIMVASTATIN 20 MG PO TABS
20 MG | ORAL_TABLET | ORAL | 0 refills | Status: DC
Start: 2021-10-23 — End: 2022-01-28

## 2021-10-30 ENCOUNTER — Encounter

## 2021-10-31 LAB — COMPREHENSIVE METABOLIC PANEL
ALT: 21 U/L (ref 10–40)
AST: 23 U/L (ref 15–37)
Albumin/Globulin Ratio: 2 (ref 1.1–2.2)
Albumin: 4.3 g/dL (ref 3.4–5.0)
Alkaline Phosphatase: 132 U/L — ABNORMAL HIGH (ref 40–129)
Anion Gap: 11 (ref 3–16)
BUN: 15 mg/dL (ref 7–20)
CO2: 22 mmol/L (ref 21–32)
Calcium: 8.8 mg/dL (ref 8.3–10.6)
Chloride: 103 mmol/L (ref 99–110)
Creatinine: 0.9 mg/dL (ref 0.9–1.3)
Est, Glom Filt Rate: >60 (ref >60)
Glucose: 106 mg/dL — ABNORMAL HIGH (ref 70–99)
Potassium: 4.2 mmol/L (ref 3.5–5.1)
Sodium: 136 mmol/L (ref 136–145)
Total Bilirubin: 0.5 mg/dL (ref 0.0–1.0)
Total Protein: 6.5 g/dL (ref 6.4–8.2)

## 2021-10-31 LAB — CBC WITH AUTO DIFFERENTIAL
Basophils %: 0.6 %
Basophils Absolute: 0 10*3/uL (ref 0.0–0.2)
Eosinophils %: 4.9 %
Eosinophils Absolute: 0.3 10*3/uL (ref 0.0–0.6)
Hematocrit: 45.4 % (ref 40.5–52.5)
Hemoglobin: 15.5 g/dL (ref 13.5–17.5)
Lymphocytes %: 17.9 %
Lymphocytes Absolute: 1.1 10*3/uL (ref 1.0–5.1)
MCH: 30.5 pg (ref 26.0–34.0)
MCHC: 34.1 g/dL (ref 31.0–36.0)
MCV: 89.6 fL (ref 80.0–100.0)
MPV: 8.4 fL (ref 5.0–10.5)
Monocytes %: 11.4 %
Monocytes Absolute: 0.7 10*3/uL (ref 0.0–1.3)
Neutrophils %: 65.2 %
Neutrophils Absolute: 4.1 10*3/uL (ref 1.7–7.7)
Platelets: 236 10*3/uL (ref 135–450)
RBC: 5.07 M/uL (ref 4.20–5.90)
RDW: 13.6 % (ref 12.4–15.4)
WBC: 6.2 10*3/uL (ref 4.0–11.0)

## 2021-10-31 LAB — T4, FREE: T4 Free: 1.5 ng/dL (ref 0.9–1.8)

## 2021-10-31 LAB — HEMOGLOBIN A1C
Hemoglobin A1C: 5.3 %
eAG: 105.4 mg/dL

## 2021-10-31 LAB — LIPID, FASTING
Cholesterol, Fasting: 157 mg/dL (ref 0–199)
HDL: 35 mg/dL — ABNORMAL LOW (ref 40–60)
LDL Calculated: 90 mg/dL (ref <100)
Triglyceride, Fasting: 160 mg/dL — ABNORMAL HIGH (ref 0–150)
VLDL Cholesterol Calculated: 32 mg/dL

## 2021-10-31 LAB — TSH: TSH: 1.77 u[IU]/mL (ref 0.27–4.20)

## 2021-11-01 ENCOUNTER — Encounter
Admit: 2021-11-01 | Discharge: 2021-11-01 | Payer: BLUE CROSS/BLUE SHIELD | Attending: Family Medicine | Primary: Family Medicine

## 2021-11-01 DIAGNOSIS — Z0000 Encounter for general adult medical examination without abnormal findings: Secondary | ICD-10-CM

## 2021-11-01 NOTE — Progress Notes (Signed)
Ronnie King   61 y.o. male   [birth year]    HPI:    Patient was last seen by me on 10/19/2020.  During our last office visit:   -This was his annual physical.    Reason(s) for visit:   Chief Complaint   Patient presents with    Annual Exam     Interval health history:    [x]  Patient reported no new health issues or concerns.      [x]  Chronic health issues are overall stable on current medical regiment (listed below).    [x]  Other noteworthy comments: patient had labs for his annual physical prior to his office visit.  We discussed those lab results today.    Patient works 100% remotely.    Health maintenance:    Weight/BMI:    Wt Readings from Last 3 Encounters:   11/01/21 199 lb (90.3 kg)   10/19/20 204 lb 12.8 oz (92.9 kg)   04/20/20 202 lb 3.2 oz (91.7 kg)     -Physical activity: []  Little to no activity  []  Other:     BP:  -BP in office: [x]  Normal []  Low []  Elevated    -Patient has not been checking BP readings regularly.  -Patient denied issues with frequent headache, chest pain, shortness of breath, palpitations, malaise, etc.    Diabetes screening:  -Last A1c =    Lab Results   Component Value Date    LABA1C 5.3 10/30/2021    LABA1C 5.3 12/31/2020     Colon CA screening:  -Last exam: 1/16-014 [facility].  [x]  Up-to-date: [x]  Colonoscopy []  Cologuard []  FIT  []  Not up-to-date.   []  Unknown/can't recall.  []  None performed before.  []  Non-applicable.  -No known FMH of colon CA.   -No issues reported of hematochezia, melena, significant unintentional weight loss, chronic abdominal pain, nausea, vomiting, diarrhea, etc.    Prostate CA screening:  -Last PSA test:   No results found for: PSA, PSADIA  []  Up-to-date:   [x]  Not up-to-date   []  Unknown/can't recall  []  None performed before  []  Non-applicable   -No known FMH of prostate CA.   -No issues with frequent or urgent need to urinate, nocturia more than twice a night, difficulty starting urination, weak urine stream, dribbling at the end of  urination, inability to completely empty the bladder, hematuria, etc.    Vision exam:  -Last exam:  [x]  Up-to-date:   []  Not up-to-date   []  Unknown/can't recall  []  None performed before  []  Non-applicable   -No visual disturbances reported.     Dental exam:  -Last exam:  [x]  Up-to-date:   []  Not up-to-date   []  Unknown/can't recall  []  None performed before  []  Non-applicable   -No issues reported of dental pain, bleeding gums, temperature sensitivity, etc.     Health Maintenance Due   Topic Date Due    HIV screen  Never done    Hepatitis C screen  Never done    Colorectal Cancer Screen  Never done    Shingles vaccine (1 of 2) Never done    COVID-19 Vaccine (4 - Booster) 08/07/2020    Depression Screen  04/20/2021       Immunization History   Administered Date(s) Administered    COVID-19, PFIZER PURPLE top, DILUTE for use, (age 60 y+), 10mcg/0.3mL 09/17/2019, 06/12/2020    Hep A, HAVRIX, VAQTA, (age 19y+), IM, 1mL 10/15/2013, 07/21/2014    Influenza Virus Vaccine 03/24/2019  TDaP, ADACEL (age 88y-64y), Leda Min (age 10y+), IM, 0.18mL 10/15/2013, 11/15/2018       Allergies   Allergen Reactions    Milk-Related Compounds        Current Outpatient Medications:     simvastatin (ZOCOR) 20 MG tablet, TAKE 1 TABLET BY MOUTH ONCE NIGHTLY, Disp: 90 tablet, Rfl: 0    omeprazole (PRILOSEC) 20 MG delayed release capsule, TAKE 1 CAPSULE BY MOUTH DAILY, Disp: 90 capsule, Rfl: 0    lisinopril (PRINIVIL;ZESTRIL) 10 MG tablet, Take 1 tablet by mouth daily, Disp: 90 tablet, Rfl: 3    Omega-3 Fatty Acids (FISH OIL PO), Take 3,000 mg by mouth, Disp: , Rfl:     Multiple Vitamins-Minerals (CENTRUM SILVER 50+MEN PO), Take by mouth, Disp: , Rfl:     EPINEPHrine HCl, Anaphylaxis, (EPIPEN IM), Inject into the muscle, Disp: , Rfl:     aspirin 81 MG tablet, Take 1 tablet by mouth daily, Disp: , Rfl:     Cyanocobalamin (B-12 PO), Take 1,000 mg by mouth (Patient not taking: Reported on 11/01/2021), Disp: , Rfl:     Cholecalciferol (D3 PO), Take  4,000 mg by mouth (Patient not taking: Reported on 11/01/2021), Disp: , Rfl:       No family history on file.      Social History     Tobacco Use    Smoking status: Never    Smokeless tobacco: Former   Substance Use Topics    Alcohol use: Yes     Alcohol/week: 2.0 standard drinks     Types: 2 Cans of beer per week     Comment: Occ        Lab Results   Component Value Date    WBC 6.2 10/30/2021    HGB 15.5 10/30/2021    HCT 45.4 10/30/2021    MCV 89.6 10/30/2021    PLT 236 10/30/2021       Chemistry        Component Value Date/Time    NA 136 10/30/2021 0745    K 4.2 10/30/2021 0745    CL 103 10/30/2021 0745    CO2 22 10/30/2021 0745    BUN 15 10/30/2021 0745    CREATININE 0.9 10/30/2021 0745        Component Value Date/Time    CALCIUM 8.8 10/30/2021 0745    ALKPHOS 132 (H) 10/30/2021 0745    AST 23 10/30/2021 0745    ALT 21 10/30/2021 0745    BILITOT 0.5 10/30/2021 0745          Lab Results   Component Value Date    ALT 21 10/30/2021    AST 23 10/30/2021    ALKPHOS 132 (H) 10/30/2021    BILITOT 0.5 10/30/2021     Lab Results   Component Value Date    LABA1C 5.3 10/30/2021     Lab Results   Component Value Date    EAG 105.4 10/30/2021     Lab Results   Component Value Date    CHOL 158 12/31/2020     Lab Results   Component Value Date    TRIG 209 (H) 12/31/2020     Lab Results   Component Value Date    HDL 35 (L) 10/30/2021    HDL 33 (L) 12/31/2020     Lab Results   Component Value Date    LDLCALC 90 10/30/2021    LDLCALC 83 12/31/2020     Lab Results   Component Value Date  LABVLDL 32 10/30/2021    LABVLDL 42 12/31/2020     No results found for: CHOLHDLRATIO     Review of Systems   Constitutional:  Negative for activity change, appetite change, fatigue, fever and unexpected weight change.   HENT:  Negative for congestion, rhinorrhea, sinus pressure and trouble swallowing.    Respiratory:  Negative for cough, chest tightness, shortness of breath and wheezing.    Cardiovascular:  Negative for chest pain, palpitations  and leg swelling.   Gastrointestinal:  Negative for abdominal distention, abdominal pain, blood in stool, constipation, diarrhea, nausea and vomiting.   Genitourinary:  Negative for dysuria, frequency and hematuria.   Musculoskeletal:  Negative for arthralgias and back pain.   Skin:  Negative for rash.   Neurological:  Negative for dizziness, weakness, light-headedness, numbness and headaches.   Psychiatric/Behavioral:  Negative for agitation, decreased concentration, dysphoric mood, sleep disturbance and suicidal ideas. The patient is not nervous/anxious.         Wt Readings from Last 3 Encounters:   11/01/21 199 lb (90.3 kg)   10/19/20 204 lb 12.8 oz (92.9 kg)   04/20/20 202 lb 3.2 oz (91.7 kg)       BP Readings from Last 3 Encounters:   11/01/21 124/72   10/19/20 118/82   04/20/20 136/86       Pulse Readings from Last 3 Encounters:   11/01/21 90   10/19/20 79   04/20/20 76       BP 124/72   Pulse 90   Ht 6' (1.829 m)   Wt 199 lb (90.3 kg)   SpO2 98%   BMI 26.99 kg/m      Physical Exam  Vitals reviewed.   Constitutional:       General: He is awake. He is not in acute distress.     Appearance: He is overweight. He is not ill-appearing or diaphoretic.   HENT:      Head: Normocephalic and atraumatic. No abrasion or masses. Hair is normal.      Right Ear: External ear normal.      Left Ear: External ear normal.      Nose: Nose normal.   Eyes:      General: Lids are normal. Gaze aligned appropriately. No scleral icterus.        Right eye: No discharge.         Left eye: No discharge.      Extraocular Movements: Extraocular movements intact.      Conjunctiva/sclera: Conjunctivae normal.   Neck:      Trachea: Phonation normal.   Cardiovascular:      Rate and Rhythm: Normal rate and regular rhythm.   Pulmonary:      Effort: Pulmonary effort is normal. No respiratory distress.      Breath sounds: No wheezing, rhonchi or rales.   Abdominal:      General: Abdomen is flat. There is no distension.      Palpations:  Abdomen is soft.   Musculoskeletal:         General: No deformity. Normal range of motion.      Cervical back: Normal range of motion. No erythema.      Right lower leg: No edema.      Left lower leg: No edema.   Skin:     Coloration: Skin is not cyanotic, jaundiced or pale.      Findings: No abrasion, abscess, bruising, ecchymosis, erythema, signs of injury, laceration, lesion, petechiae, rash or wound.  Neurological:      General: No focal deficit present.      Mental Status: He is alert. Mental status is at baseline.      GCS: GCS eye subscore is 4. GCS verbal subscore is 5. GCS motor subscore is 6.      Cranial Nerves: No cranial nerve deficit, dysarthria or facial asymmetry.      Motor: No weakness, tremor, atrophy or seizure activity.      Coordination: Coordination normal.      Gait: Gait is intact.   Psychiatric:         Attention and Perception: Attention and perception normal.         Mood and Affect: Mood and affect normal.         Speech: Speech normal.         Behavior: Behavior normal. Behavior is cooperative.         Thought Content: Thought content normal.        Assessment/Plan:   Dorin was seen today for annual exam.    Diagnoses and all orders for this visit:    Encounter for preventative adult health care examination    Screening for endocrine, nutritional, metabolic and immunity disorder  Comments:  Labs done prior today's office visit.    Overweight with body mass index (BMI) of 27 to 27.9 in adult    Gastroesophageal reflux disease with esophagitis without hemorrhage  Comments:  Stable on Omeprazole    Educated about 2019 novel coronavirus infection  Comments:  Advised to get COVID-19 Omicron vaccine    Immunization due  Comments:  1st dose of Shingrix given today.  Orders:  -     Zoster, SHINGRIX, (50 yrs +), IM      I reviewed the plan of care with the patient. Patient acknowledged understanding and agreed with plan of care overall.    Lifestyle measures discussed:  -Patient was advised  to monitor caloric/sugar/carb intake, consume small portion sizes, well-balanced meals (esp with fruits & vegetables), and exercise at least 150 min/week. We discussed also reviewing nutritional labels before purchasing a product and for simplicity sake, purchase the food product that has overall less of everything. It is also recommended to avoid frequent snacking and late night eating.    Dental exam: recommended every 6 months as well as directed by dentist    Visual exam: recommended annually    Medications Discontinued During This Encounter   Medication Reason    Sodium Chloride-Sodium Bicarb (CLASSIC NETI POT SINUS WASH) 2300-700 MG KIT LIST CLEANUP    fluticasone (FLONASE) 50 MCG/ACT nasal spray LIST CLEANUP    brompheniramine-pseudoephedrine-DM 2-30-10 MG/5ML syrup LIST CLEANUP        General information on medications:  -When it comes to medications, whether with starting or adding a new medication or increasing the dose of a current medication, the benefits and risks have to always be considered and weighed over, especially if one is taking other medications as well.   -There are no medications that have no side effects and that there is always a risk involved with taking a medication.    -If a side effect were to occur with starting a new medication or with increasing the dose of a current medication that either the medication can be totally discontinued altogether or simply decrease the dose of it and if this would be the case a follow-up appointment would be deemed necessary.    -The drug allergy list will then be  updated with the corresponding side effect(s) if it's deemed to be a true 'drug allergy'.    -The most common adverse effects of medication(s) were addressed at today's visit.    -Lastly, the coverage status of a medication may vary from insurance to insurance and the only way to verify if the medication is covered is to send an actual prescription in.    -The drug formulary of each insurance  changes without any warning or notification to the healthcare provider let alone the pharmacy.  -The cost of medications vary from insurance to insurance and the cost is always subject to change just like the drug formulary.    Follow-up: No follow-ups on file..     Patient was informed that if his or her symptoms worsen to follow up with me sooner or go to the nearest ER if the symptoms are very significant and warrant higher level of care.    Regarding my note:  -This note was composed (by me only and not with assistance via a scribe) to the best of my knowledge and recollection of the encounter with the patient using one of my own customized note templates utilizing a combination of typing and dictating with the [employer] medical speech recognition software.  As a result, the note may possibly contain various errors (e.g. spelling, grammar, and non-sensible words/phrases/statements) despite reviewing the note prior to signing it for completion.      Time spent includes some or all of the following, both face-to-face time and non face-to-face time, but is not limited to:  [x]  Preparing to see the patient by reviewing medical records available (notes, labs, imaging, etc.) prior to seeing the patient.  [x]  Obtaining and/or reviewing the history from the patient.  [x]  Performing a medically appropriate examination.  [x]  Ordering of relevant lab work, medications, referrals, or procedures.  [x]  Discussing patient's medical issues and formulating an assessment and plan.   [x]  Reviewing plan of care with patient.  Answering any questions or concerns.   [x]  Documentation within the electronic health record (EHR).  []  Reviewing records of history relevant to patient's issues after seeing the patient.  []  Discussion or coordination of care with other health care professionals.  []  Other:     Dub Maclellan P. [name] M.D.  Family Medicine  [facility] - Family Medicine - Deborah Chalk    Electronically  signed by Christoper Allegra, MD M.D. on 11/01/2021 at 8:53 AM.

## 2021-11-06 ENCOUNTER — Encounter

## 2021-11-06 MED ORDER — LISINOPRIL 10 MG PO TABS
10 MG | ORAL_TABLET | ORAL | 1 refills | Status: DC
Start: 2021-11-06 — End: 2022-01-28

## 2021-11-06 NOTE — Telephone Encounter (Signed)
Requested Prescriptions     Pending Prescriptions Disp Refills    lisinopril (PRINIVIL;ZESTRIL) 10 MG tablet [Pharmacy Med Name: LISINOPRIL 10 MG TABLET] 90 tablet 2     Sig: TAKE 1 TABLET BY MOUTH EVERY DAY     Last OV - 11/01/21  Next OV - none  Last refill - 10/21/20  Last labs - 10/30/21

## 2022-01-17 ENCOUNTER — Encounter

## 2022-01-18 ENCOUNTER — Encounter

## 2022-01-18 NOTE — Telephone Encounter (Signed)
From: Priscille Kluver  To: Dr. Christoper Allegra  Sent: 01/18/2022 3:11 PM EDT  Subject: Prescription refill    Hi Dr. [name],  My Pharmacy said you needed to speak to me before you would refill the simvastatin.   Thank you,  Tammy Sours

## 2022-01-19 NOTE — Telephone Encounter (Signed)
LRX: 10/23/21  LOV: Physical 11/01/21. Return noted for 1 year  NOV: Nothing scheduled  Last lab: 10/30/21

## 2022-01-26 ENCOUNTER — Encounter

## 2022-01-28 MED ORDER — LISINOPRIL 10 MG PO TABS
10 MG | ORAL_TABLET | Freq: Every day | ORAL | 1 refills | Status: AC
Start: 2022-01-28 — End: ?

## 2022-01-28 MED ORDER — SIMVASTATIN 20 MG PO TABS
20 MG | ORAL_TABLET | ORAL | 1 refills | Status: AC
Start: 2022-01-28 — End: ?

## 2022-01-28 MED ORDER — OMEPRAZOLE 20 MG PO CPDR
20 MG | ORAL_CAPSULE | Freq: Every day | ORAL | 1 refills | Status: AC
Start: 2022-01-28 — End: ?

## 2022-08-08 ENCOUNTER — Encounter

## 2022-08-13 NOTE — Progress Notes (Signed)
Patient's HM shows they are overdue for Colorectal Screening.   Care Everywhere and [employer] files searched.  HM updated with 2014 colonscopy.

## 2023-07-23 IMAGING — MR MULTI PARAMETRIC MRI PROSTATE W/O AND W CONTRAST
13 series · 48 of 48 positions shown · IV contrast (gadavist)
Comparison: None

________________________________________________________________________________________________ 
MULTI PARAMETRIC MRI PROSTATE W/O AND W CONTRAST, 07/23/2023 [DATE]: 
CLINICAL INDICATION: Elevated Prostate Specific Antigen [psa]
TECHNIQUE: Multiple parametric sequences were performed Pre-contrast: T1 axial 
of the entire pelvis. T2 sagittal, axial  and coronal,T1 axial, acquired of the 
prostate. Diffusion with multiple B values of 3888, 7477 calculated ADC value 
for mapping. Post contrast: Rapid sequence dynamic and axial planes through the 
prostate and seminal vesicles,T1 axial with fat sat of the entire pelvis. 3-D 
renderings were reconstructed on an independent workstation. The images were 
also evaluated with Dyna CAD computer aided detection. 9 mL of Gadavist were 
injected intravenously. 1 mL of Gadavist discarded. As per [HOSPITAL] guidelines 3D reconstructions are performed with concurrent physician 
supervision. Patient was scanned on a 3T magnet.

[Series 101: survey-mst · axial · 10.0mm · 1.34mm/px · 1 of 14 slices shown]
[im 1/14]
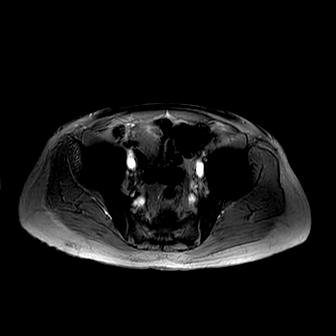

[Series 201: T1 · axial · 6.0mm · 0.67mm/px · 1 of 36 slices shown]
[im 1/36]
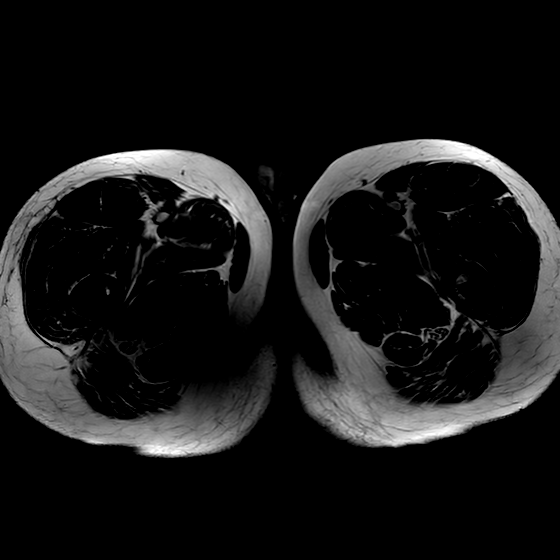

[Series 301: t2w sag · sagittal · 3.0mm · 0.45mm/px · 1 of 30 slices shown]
[im 1/30]
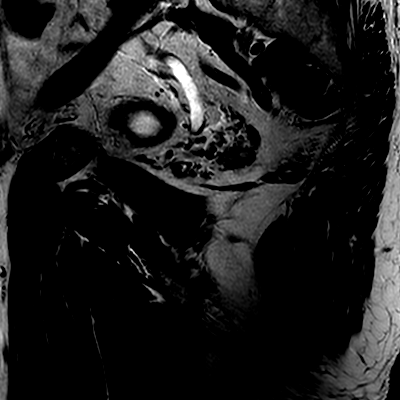

[Series 401: t2w cor · coronal · 3.0mm · 0.42mm/px · 1 of 32 slices shown]
[im 1/32]
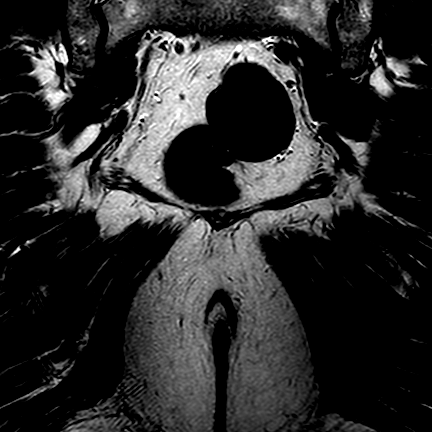

[Series 501: t2w ax · axial · 3.0mm · 0.27mm/px · 1 of 32 slices shown]
[im 1/32]
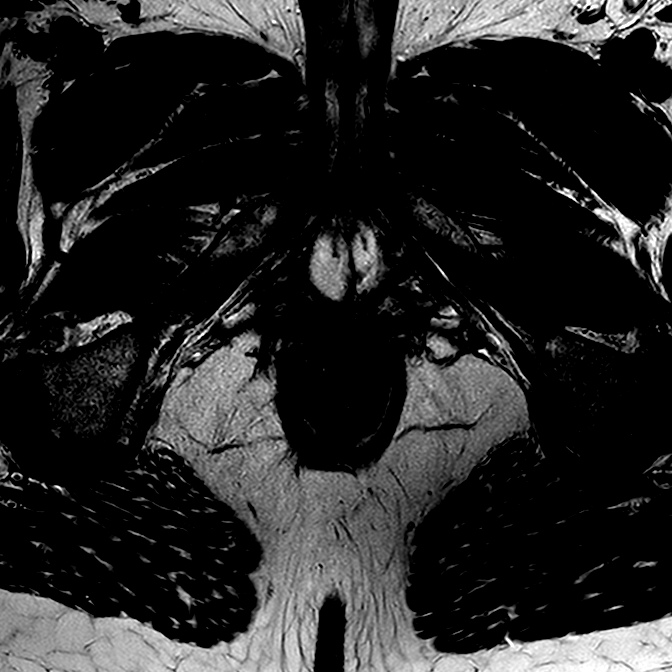

[Series 601: DWI · axial · 3.0mm · 0.96mm/px · 1 of 64 slices shown]
[im 1/64]
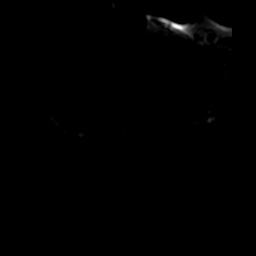

[Series 602: ADC · axial · 3.0mm · 0.96mm/px · 1 of 31 slices shown (1 of 2)]
[im 1/31]
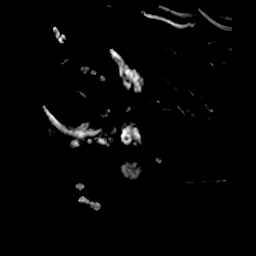

[Series 603: ADC · axial · 3.0mm · 0.96mm/px · 1 of 32 slices shown (2 of 2)]
[im 1/32]
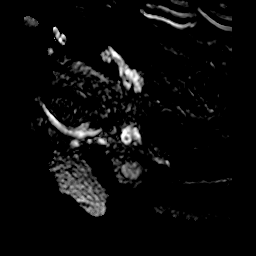

[Series 604: (id) · axial · 3.0mm · 0.96mm/px · 1 of 32 slices shown (1 of 2)]
[im 1/32]
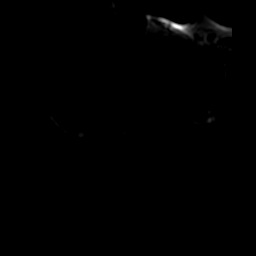

[Series 605: (id) · axial · 3.0mm · 0.96mm/px · 1 of 32 slices shown (2 of 2)]
[im 1/32]
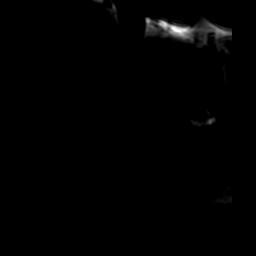

[Series 701: (id) ax · axial · 3.0mm · 1.28mm/px · 1 of 32 slices shown]
[im 1/32]
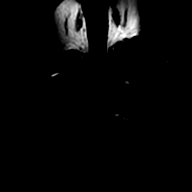

[Series 901: dyn 3mm*(ap) · axial · 3.0mm · 1.38mm/px · z∈[-121,-28]mm · 36 of 1440 slices shown]
[im 1/1440]
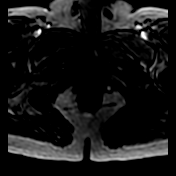
[im 42/1440]
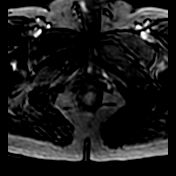
[im 83/1440]
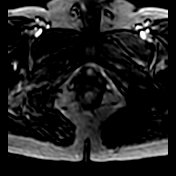
[im 124/1440]
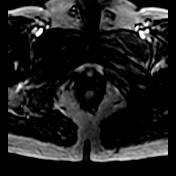
[im 165/1440]
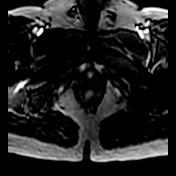
[im 206/1440]
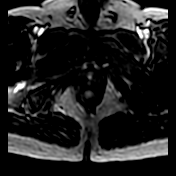
[im 247/1440]
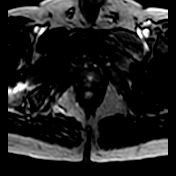
[im 288/1440]
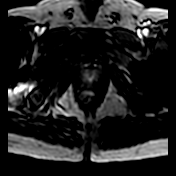
[im 329/1440]
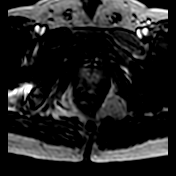
[im 371/1440]
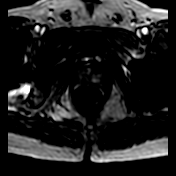
[im 412/1440]
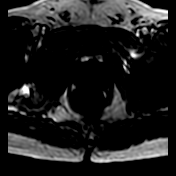
[im 453/1440]
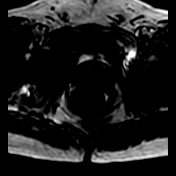
[im 494/1440]
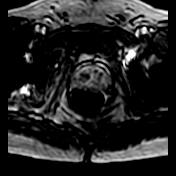
[im 535/1440]
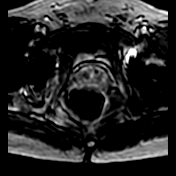
[im 576/1440]
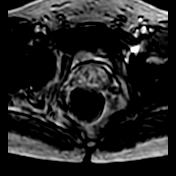
[im 617/1440]
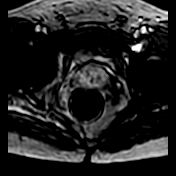
[im 658/1440]
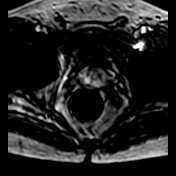
[im 699/1440]
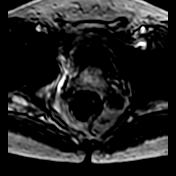
[im 741/1440]
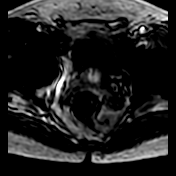
[im 782/1440]
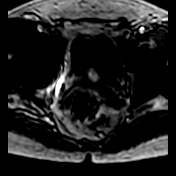
[im 823/1440]
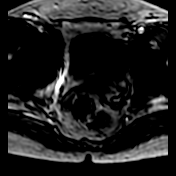
[im 864/1440]
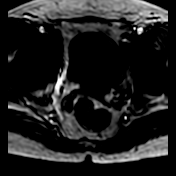
[im 905/1440]
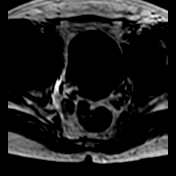
[im 946/1440]
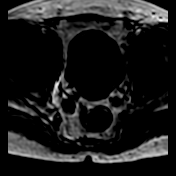
[im 987/1440]
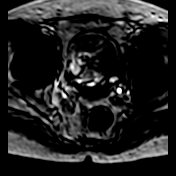
[im 1028/1440]
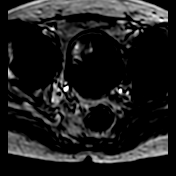
[im 1069/1440]
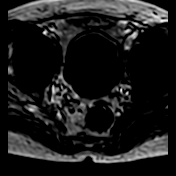
[im 1111/1440]
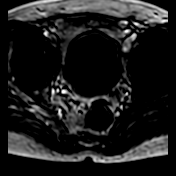
[im 1152/1440]
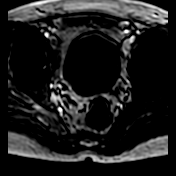
[im 1193/1440]
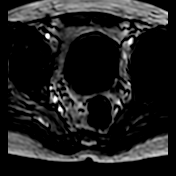
[im 1234/1440]
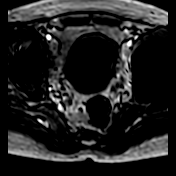
[im 1275/1440]
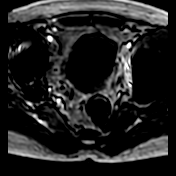
[im 1316/1440]
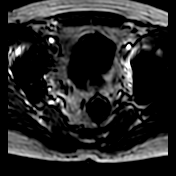
[im 1357/1440]
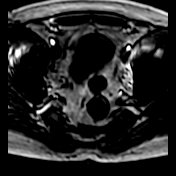
[im 1398/1440]
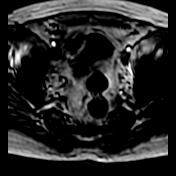
[im 1440/1440]
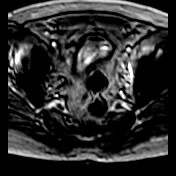

[Series 1002: DIXON · axial · 6.0mm · 0.49mm/px · 1 of 36 slices shown]
[im 1/36]
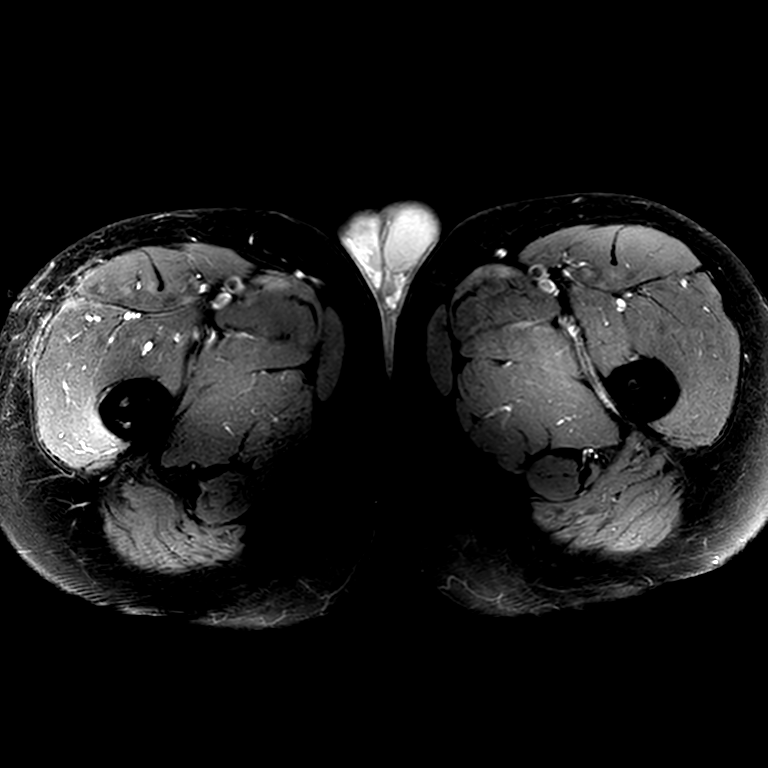

[48 of 48 positions shown; findings below may reference images not displayed]

FINDINGS: VOLUME: 41 cc 
CENTRAL GLAND: Nodular in appearance. No focal area suspicious for malignancy. 
PERIPHERAL ZONE: There are areas of decreased T2 signal. Unfortunately the ADC 
images and diffusion images are essentially nondiagnostic secondary to artifact 
from bilateral hip surgeries. No one area stands out suspicious for malignancy 
though this is suboptimally evaluated. 
PROSTATE CAPSULE: Intact. 
MUSCLE SIDE WALLS: Normal in appearance. 
SEMINAL VESICLES: Normal in appearance. 
BLADDER: Mildly trabeculated. 
LYMPHADENOPATHY: No suspicious adenopathy identified. 
BONES: No osseous lesion seen. 
ADDITIONAL FINDINGS: Bilateral hip prostheses.
IMPRESSION: Postoperative changes limits evaluation taking diffusion images essentially 
nondiagnostic. No discrete mass seen on T2-weighted images. 
(PI-RADS 3): Intermediate ( the presence of clinically significant cancer is 
equivocal).
